# Patient Record
Sex: Male | Born: 1980 | Race: Black or African American | Hispanic: No | Marital: Married
Health system: Southern US, Community
[De-identification: ages and names within clinical notes are randomized; demographics above are authoritative.]

---

## 2010-11-07 ENCOUNTER — Emergency Department (HOSPITAL_COMMUNITY): Admission: EM | Admit: 2010-11-07 | Discharge: 2010-11-07 | Payer: Self-pay | Admitting: Emergency Medicine

## 2011-02-28 LAB — POCT I-STAT, CHEM 8
Creatinine, Ser: 1.2 mg/dL (ref 0.4–1.5)
HCT: 49 % (ref 39.0–52.0)
Hemoglobin: 16.7 g/dL (ref 13.0–17.0)
Potassium: 3.7 mEq/L (ref 3.5–5.1)
Sodium: 141 mEq/L (ref 135–145)
TCO2: 25 mmol/L (ref 0–100)

## 2011-02-28 LAB — DIFFERENTIAL
Basophils Absolute: 0 10*3/uL (ref 0.0–0.1)
Basophils Relative: 1 % (ref 0–1)
Eosinophils Absolute: 0.2 10*3/uL (ref 0.0–0.7)
Eosinophils Relative: 4 % (ref 0–5)
Neutrophils Relative %: 45 % (ref 43–77)

## 2011-02-28 LAB — CBC
MCH: 31.7 pg (ref 26.0–34.0)
MCHC: 34.5 g/dL (ref 30.0–36.0)
MCV: 91.8 fL (ref 78.0–100.0)
Platelets: 192 10*3/uL (ref 150–400)
RDW: 13 % (ref 11.5–15.5)

## 2011-02-28 LAB — POCT CARDIAC MARKERS: Myoglobin, poc: 46.4 ng/mL (ref 12–200)

## 2011-06-19 IMAGING — CR DG CHEST 2V
2 series · 2 of 2 positions shown · non-contrast
Comparison: None.

CLINICAL DATA: Chest pain.

CHEST - 2 VIEW

[w chest pa]
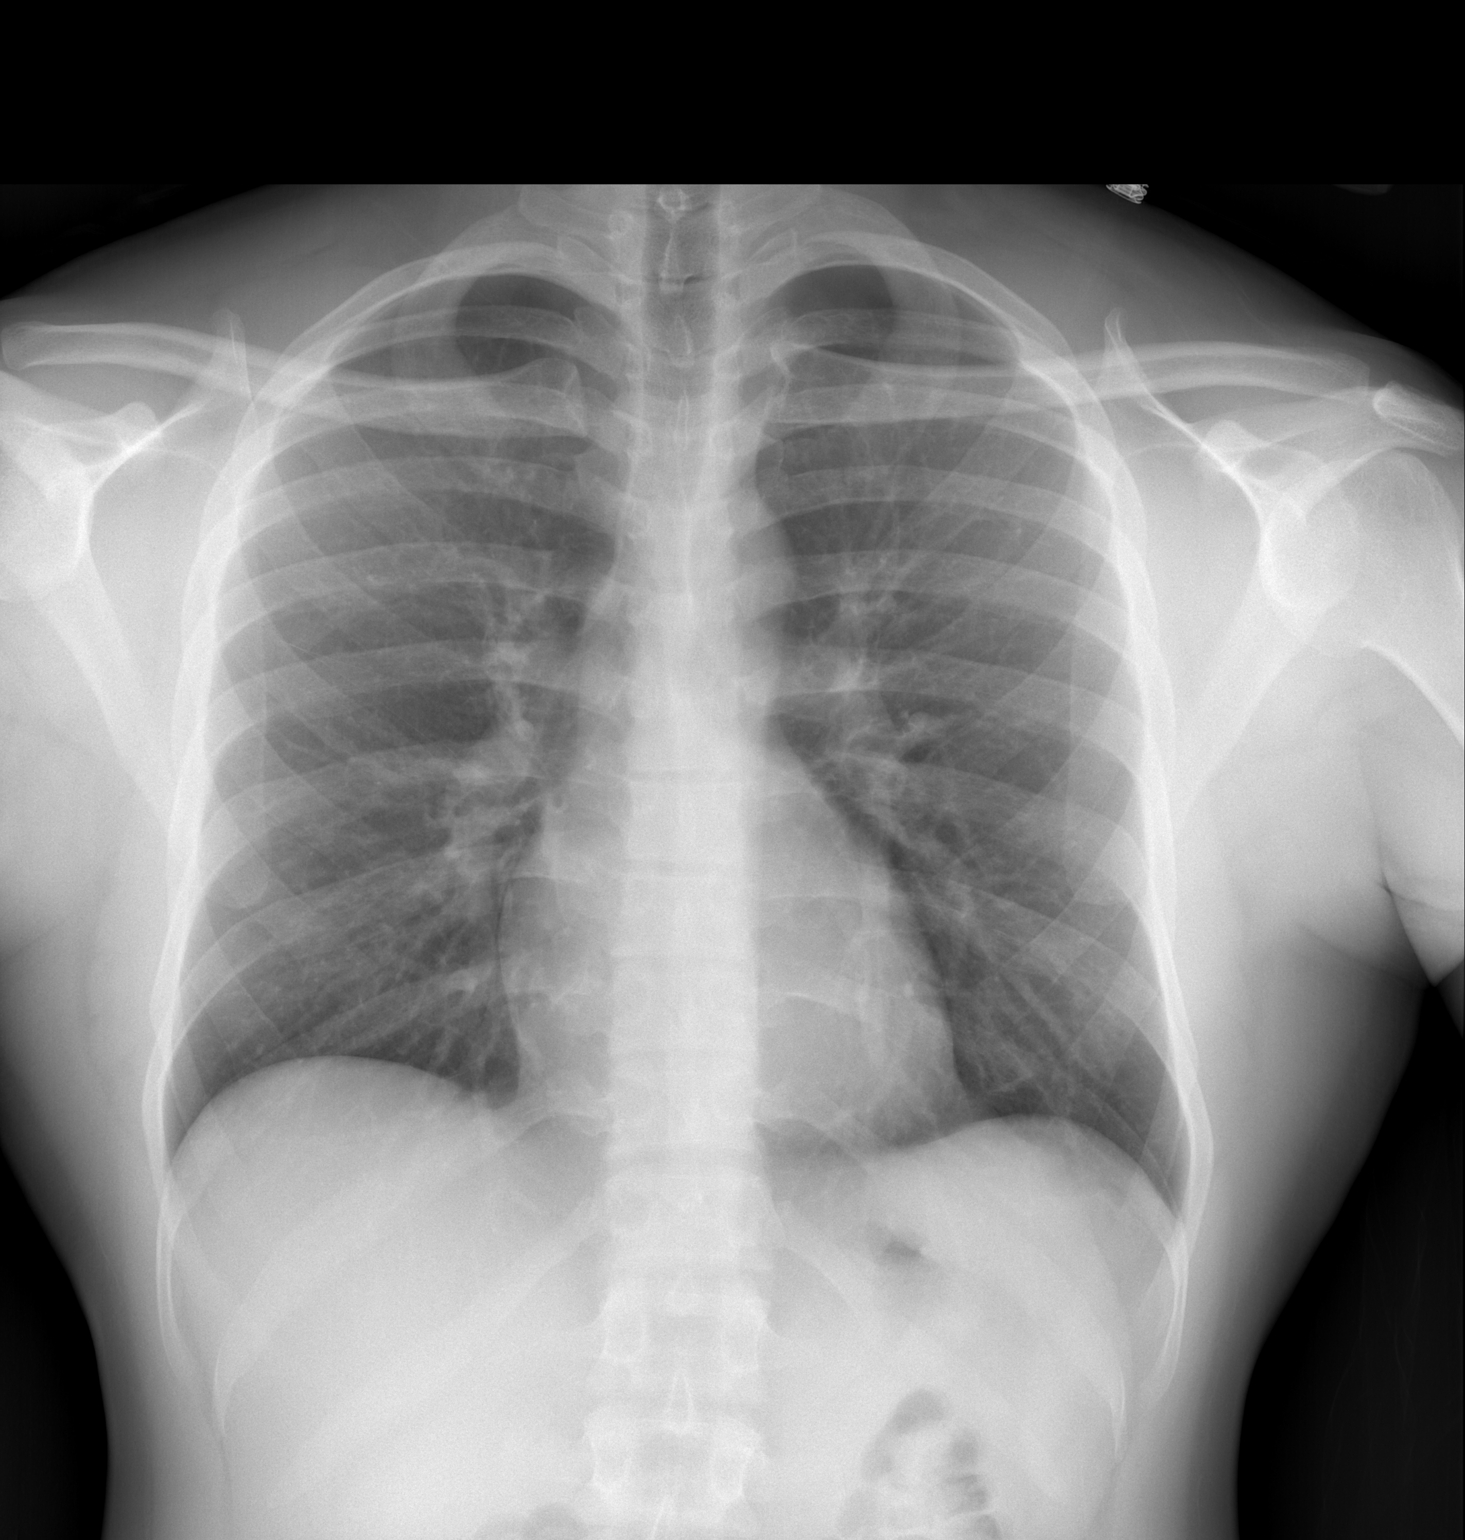

[w chest lat]
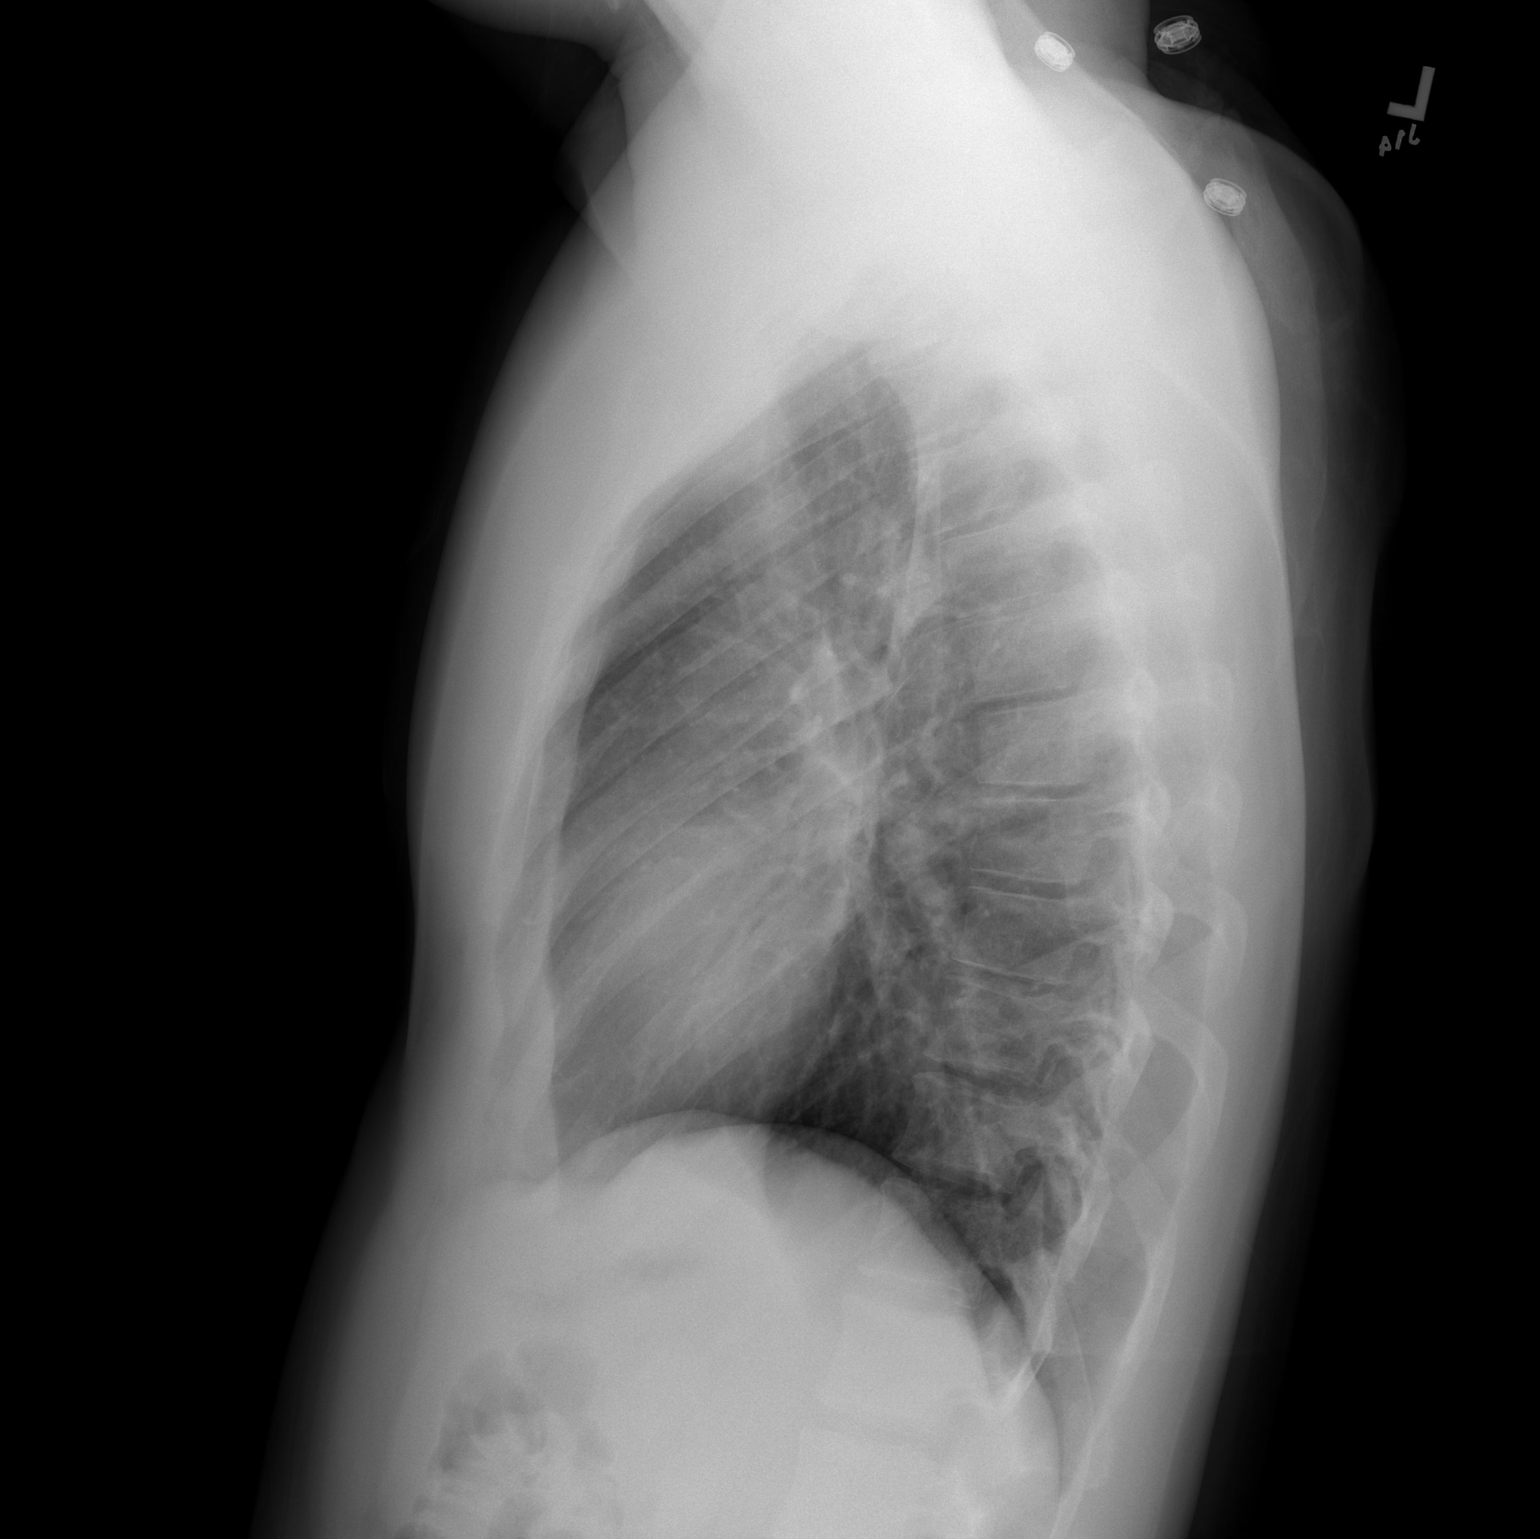

[2 of 2 positions shown; findings below may reference images not displayed]

FINDINGS: Lungs are clear.  No pneumothorax or pleural effusion.
Heart size normal.  No focal bony abnormality.
IMPRESSION: Normal chest.

## 2023-01-30 ENCOUNTER — Emergency Department (HOSPITAL_COMMUNITY)
Admission: EM | Admit: 2023-01-30 | Discharge: 2023-01-30 | Disposition: A | Discharge status: Home / Self Care | Payer: Federal, State, Local not specified - PPO | Attending: Emergency Medicine | Admitting: Emergency Medicine

## 2023-01-30 ENCOUNTER — Emergency Department (HOSPITAL_COMMUNITY): Payer: Federal, State, Local not specified - PPO

## 2023-01-30 ENCOUNTER — Encounter (HOSPITAL_COMMUNITY): Payer: Self-pay

## 2023-01-30 ENCOUNTER — Other Ambulatory Visit: Payer: Self-pay

## 2023-01-30 DIAGNOSIS — R110 Nausea: Secondary | ICD-10-CM | POA: Insufficient documentation

## 2023-01-30 DIAGNOSIS — R197 Diarrhea, unspecified: Secondary | ICD-10-CM | POA: Diagnosis not present

## 2023-01-30 DIAGNOSIS — R1011 Right upper quadrant pain: Secondary | ICD-10-CM | POA: Insufficient documentation

## 2023-01-30 LAB — CBC WITH DIFFERENTIAL/PLATELET
Abs Immature Granulocytes: 0.02 10*3/uL (ref 0.00–0.07)
Basophils Absolute: 0 10*3/uL (ref 0.0–0.1)
Basophils Relative: 1 %
Eosinophils Absolute: 0.1 10*3/uL (ref 0.0–0.5)
Eosinophils Relative: 3 %
HCT: 44.5 % (ref 39.0–52.0)
Hemoglobin: 14.9 g/dL (ref 13.0–17.0)
Immature Granulocytes: 1 %
Lymphocytes Relative: 19 %
Lymphs Abs: 0.8 10*3/uL (ref 0.7–4.0)
MCH: 29.5 pg (ref 26.0–34.0)
MCHC: 33.5 g/dL (ref 30.0–36.0)
MCV: 88.1 fL (ref 80.0–100.0)
Monocytes Absolute: 0.2 10*3/uL (ref 0.1–1.0)
Monocytes Relative: 5 %
Neutro Abs: 3.1 10*3/uL (ref 1.7–7.7)
Neutrophils Relative %: 71 %
Platelets: 167 10*3/uL (ref 150–400)
RBC: 5.05 MIL/uL (ref 4.22–5.81)
RDW: 12.9 % (ref 11.5–15.5)
WBC: 4.3 10*3/uL (ref 4.0–10.5)
nRBC: 0 % (ref 0.0–0.2)

## 2023-01-30 LAB — COMPREHENSIVE METABOLIC PANEL
ALT: 27 U/L (ref 0–44)
AST: 19 U/L (ref 15–41)
Albumin: 4 g/dL (ref 3.5–5.0)
Alkaline Phosphatase: 53 U/L (ref 38–126)
Anion gap: 5 (ref 5–15)
BUN: 12 mg/dL (ref 6–20)
CO2: 29 mmol/L (ref 22–32)
Calcium: 8.7 mg/dL — ABNORMAL LOW (ref 8.9–10.3)
Chloride: 107 mmol/L (ref 98–111)
Creatinine, Ser: 1.17 mg/dL (ref 0.61–1.24)
GFR, Estimated: >60 mL/min (ref >60)
Glucose, Bld: 122 mg/dL — ABNORMAL HIGH (ref 70–99)
Potassium: 4.1 mmol/L (ref 3.5–5.1)
Sodium: 141 mmol/L (ref 135–145)
Total Bilirubin: 0.8 mg/dL (ref 0.3–1.2)
Total Protein: 6.9 g/dL (ref 6.5–8.1)

## 2023-01-30 LAB — LIPASE, BLOOD: Lipase: 28 U/L (ref 11–51)

## 2023-01-30 MED ORDER — FENTANYL CITRATE PF 50 MCG/ML IJ SOSY
50.0000 ug | PREFILLED_SYRINGE | Freq: Once | INTRAMUSCULAR | Status: AC
  Administered 2023-01-30: 50 ug via INTRAVENOUS
  Filled 2023-01-30: qty 1

## 2023-01-30 MED ORDER — ONDANSETRON HCL 4 MG PO TABS: 4.0000 mg | ORAL_TABLET | Freq: Three times a day (TID) | ORAL | 0 refills | Status: AC | PRN

## 2023-01-30 MED ORDER — FAMOTIDINE 20 MG PO TABS: 20.0000 mg | ORAL_TABLET | Freq: Two times a day (BID) | ORAL | 0 refills | Status: AC

## 2023-01-30 MED ORDER — ONDANSETRON HCL 4 MG PO TABS: 4.0000 mg | ORAL_TABLET | Freq: Three times a day (TID) | ORAL | 0 refills | Status: DC | PRN

## 2023-01-30 MED ORDER — IOHEXOL 300 MG/ML  SOLN
100.0000 mL | Freq: Once | INTRAMUSCULAR | Status: AC | PRN
  Administered 2023-01-30: 100 mL via INTRAVENOUS

## 2023-01-30 MED ORDER — FAMOTIDINE 20 MG PO TABS: 20.0000 mg | ORAL_TABLET | Freq: Two times a day (BID) | ORAL | 0 refills | Status: DC

## 2023-01-30 MED ORDER — OXYCODONE-ACETAMINOPHEN 5-325 MG PO TABS: 1.0000 | ORAL_TABLET | Freq: Four times a day (QID) | ORAL | 0 refills | Status: AC | PRN

## 2023-01-30 NOTE — ED Triage Notes (Signed)
RUQ pain that started this AM, worse after eating pizza around 8am. Pt had nausea, no vomiting. Nausea has subsided. Pt also had a few episodes of diarrhea this AM.

## 2023-01-30 NOTE — ED Provider Notes (Signed)
Patient care taken over at shift handoff from outgoing provider.  Physical Exam  BP (!) 133/95   Pulse 77   Temp 97.6 F (36.4 C) (Oral)   Resp 14   Ht 5' 9"$  (1.753 m)   Wt 88.5 kg   SpO2 96%   BMI 28.80 kg/m   Physical Exam Vitals and nursing note reviewed.  Constitutional:      Appearance: Normal appearance.  HENT:     Head: Normocephalic and atraumatic.     Mouth/Throat:     Mouth: Mucous membranes are moist.  Eyes:     General: No scleral icterus. Cardiovascular:     Rate and Rhythm: Normal rate and regular rhythm.     Pulses: Normal pulses.     Heart sounds: Normal heart sounds.  Pulmonary:     Effort: Pulmonary effort is normal.     Breath sounds: Normal breath sounds.  Abdominal:     General: Abdomen is flat.     Palpations: Abdomen is soft.     Tenderness: There is abdominal tenderness in the right upper quadrant and right lower quadrant.  Musculoskeletal:        General: No deformity.  Skin:    General: Skin is warm.     Findings: No rash.  Neurological:     General: No focal deficit present.     Mental Status: He is alert.  Psychiatric:        Mood and Affect: Mood normal.     Procedures  Procedures  ED Course / MDM    Medical Decision Making Amount and/or Complexity of Data Reviewed Labs: ordered. Radiology: ordered.  Risk Prescription drug management.   41 year old male presents today for evaluation of right upper quadrant abdominal pain which began at 8 AM today.  Workup with negative ultrasound and CT scan of the abdomen and pelvis.  White count was unremarkable.  Electrolytes with no abnormalities.  Pain is controlled with fentanyl.  Rx of Zofran, Pepcid, Percocet was sent. Advised patient to take Tylenol/ibuprofen/naproxen or Percocet as needed for pain, follow-up with primary care physician for further evaluation and management, return to the ER if new or worsening symptoms.  Disposition Continued outpatient therapy. Follow-up with PCP  recommended for reevaluation of symptoms. Treatment plan discussed with patient.  Pt acknowledged understanding was agreeable to the plan. Worrisome signs and symptoms were discussed with patient, and patient acknowledged understanding to return to the ED if they noticed these signs and symptoms. Patient was stable upon discharge.   This chart was dictated using voice recognition software.  Despite best efforts to proofread,  errors can occur which can change the documentation meaning.          Rex Kras, PA 01/30/23 2129    Godfrey Pick, MD 01/31/23 [phone]

## 2023-01-30 NOTE — ED Provider Notes (Addendum)
[facility] Provider Note   CSN: [id number] Arrival date & time: 01/30/23  1118     History  Chief Complaint  Patient presents with   Abdominal Pain    Kenneth Mcclure is a 42 y.o. male.  Patient presents the emergency room complaining of right upper quadrant abdominal pain which began this morning at approximately 8 AM.  Patient states he ate a piece of pizza at 8 AM before the pain started.  He complains of associated nausea with no emesis, also complains of diarrhea.  Patient denies chest pain, shortness of breath, urinary symptoms, headache, fever.  The patient states he recently had a physical and has no current medical problems that are being addressed.  He denies history of hypertension, diabetes.  HPI     Home Medications Prior to Admission medications   Medication Sig Start Date End Date Taking? Authorizing Provider  famotidine (PEPCID) 20 MG tablet Take 1 tablet (20 mg total) by mouth 2 (two) times daily. 01/30/23  Yes Cherlynn June B, PA-C  ondansetron (ZOFRAN) 4 MG tablet Take 1 tablet (4 mg total) by mouth every 8 (eight) hours as needed for nausea or vomiting. 01/30/23  Yes Dorothyann Peng, PA-C      Allergies    Patient has no allergy information on record.    Review of Systems   Review of Systems  Gastrointestinal:  Positive for abdominal pain, diarrhea and nausea. Negative for vomiting.    Physical Exam Updated Vital Signs BP 122/84   Pulse 71   Temp 97.8 F (36.6 C) (Oral)   Resp 14   Ht 5' 9"$  (1.753 m)   Wt 88.5 kg   SpO2 98%   BMI 28.80 kg/m  Physical Exam Vitals and nursing note reviewed.  Constitutional:      General: He is not in acute distress.    Appearance: He is well-developed.  HENT:     Head: Normocephalic and atraumatic.  Eyes:     General: No scleral icterus.    Conjunctiva/sclera: Conjunctivae normal.  Cardiovascular:     Rate and Rhythm: Normal rate and regular rhythm.      Heart sounds: No murmur heard. Pulmonary:     Effort: Pulmonary effort is normal. No respiratory distress.     Breath sounds: Normal breath sounds.  Abdominal:     Palpations: Abdomen is soft.     Tenderness: There is abdominal tenderness (Mild right sided tenderness).  Musculoskeletal:        General: No swelling.     Cervical back: Neck supple.  Skin:    General: Skin is warm and dry.     Capillary Refill: Capillary refill takes less than 2 seconds.  Neurological:     Mental Status: He is alert.  Psychiatric:        Mood and Affect: Mood normal.     ED Results / Procedures / Treatments   Labs (all labs ordered are listed, but only abnormal results are displayed) Labs Reviewed  COMPREHENSIVE METABOLIC PANEL - Abnormal; Notable for the following components:      Result Value   Glucose, Bld 122 (*)    Calcium 8.7 (*)    All other components within normal limits  LIPASE, BLOOD  CBC WITH DIFFERENTIAL/PLATELET  URINALYSIS, ROUTINE W REFLEX MICROSCOPIC    EKG None  Radiology US Abdomen Limited RUQ (LIVER/GB)  Result Date: 01/30/2023 CLINICAL DATA:  Right upper quadrant pain EXAM: ULTRASOUND ABDOMEN LIMITED RIGHT  UPPER QUADRANT COMPARISON:  None Available. FINDINGS: Gallbladder: No gallstones or wall thickening visualized. No sonographic Murphy sign noted by sonographer. Common bile duct: Diameter: 2.6 mm, normal.  No intrahepatic ductal dilation. Liver: Increased liver echogenicity. No focal lesion identified. Portal vein is patent on color Doppler imaging with normal direction of blood flow towards the liver. Other: None. IMPRESSION: No evidence of cholecystitis or biliary obstruction. Hepatic steatosis. Electronically Signed   By: Maurine Simmering M.D.   On: 01/30/2023 13:15    Procedures Procedures    Medications Ordered in ED Medications - No data to display  ED Course/ Medical Decision Making/ A&P                             Medical Decision Making Amount and/or  Complexity of Data Reviewed Labs: ordered. Radiology: ordered.  Risk Prescription drug management.   This patient presents to the ED for concern of abdominal pain, this involves an extensive number of treatment options, and is a complaint that carries with it a high risk of complications and morbidity.  The differential diagnosis includes cholecystitis, choledocholithiasis, gastroenteritis, gastritis, appendicitis   Co morbidities that complicate the patient evaluation  None   Additional history obtained:  Additional history obtained from EMS    Lab Tests:  I Ordered, and personally interpreted labs.  The pertinent results include: Grossly unremarkable CMP, CBC; lipase 28   Imaging Studies ordered:  I ordered imaging studies including right upper quadrant ultrasound I independently visualized and interpreted imaging which showed  No evidence of cholecystitis or biliary obstruction.    Hepatic steatosis.   I agree with the radiologist interpretation   Test / Admission - Considered:  Patient's workup is grossly unremarkable.  Patient has very mild pain, no vomiting.  Right upper quadrant ultrasound shows no acute findings.  No significant abdominal tenderness to suggest appendicitis at this time.  No elevation of white count.  Plan to discharge patient home.  This may be gastritis versus reflux versus gastroenteritis.  Patient will be prescribed short course of Pepcid with recommendations to follow-up with primary care.    Just prior to discharge patient called the nurse due to his pain returning. Patient appears uncomfortable, grimacing in pain. Pain medication ordered. CT abdomen pelvis ordered. Patient care being transferred to oncoming provider at shift handoff. Disposition pending reassessment and CT results        Final Clinical Impression(s) / ED Diagnoses Final diagnoses:  Nausea  Diarrhea, unspecified type  Right upper quadrant abdominal pain    Rx /  DC Orders ED Discharge Orders          Ordered    famotidine (PEPCID) 20 MG tablet  2 times daily        01/30/23 1447    ondansetron (ZOFRAN) 4 MG tablet  Every 8 hours PRN        01/30/23 1447              [name] 01/30/23 1450    [name] 01/30/23 1515    Blanchie Dessert, MD 01/30/23 1819

## 2023-01-30 NOTE — ED Notes (Signed)
Going to d/c pt and pt states his pain has returned in his right side of abdomen. Fritz Pickerel, [place] notified

## 2023-01-30 NOTE — ED Notes (Signed)
Lab called regarding urine sample. Lab states they do not have the sample. Sample was sent at 1247.

## 2023-01-30 NOTE — ED Notes (Signed)
Pt wife at bedside.

## 2023-01-30 NOTE — Discharge Instructions (Signed)
You were evaluated today for abdominal pain.  Your workup was reassuring.  You may have a viral illness or may have inflammation causing some of your symptoms.  I have prescribed Pepcid for acid control and Zofran for nausea.  Please follow-up as needed with your primary care provider If you develop worsening abdominal pain, intractable nausea and vomiting, or other life-threatening symptoms, please return to the emergency department

## 2023-01-30 NOTE — ED Notes (Signed)
Pt to ct via stretcher

## 2024-03-03 DIAGNOSIS — B009 Herpesviral infection, unspecified: Secondary | ICD-10-CM | POA: Insufficient documentation

## 2024-05-06 ENCOUNTER — Ambulatory Visit: Admitting: Medical Genetics

## 2024-05-06 VITALS — BP 126/86 | HR 73 | Wt 196.1 lb

## 2024-05-06 DIAGNOSIS — Z1589 Genetic susceptibility to other disease: Secondary | ICD-10-CM | POA: Diagnosis not present

## 2024-05-06 NOTE — Progress Notes (Signed)
 GENETIC COUNSELING NEW PATIENT EVALUATION Patient name: Kenneth Mcclure DOB: 05-07-1981 Age: 43 y.o. MRN: [id number]  Referring Provider/Specialty: [place] Haldeman Englert Date of Evaluation: 05/06/2024 Chief Complaint/Reason for Referral: Hereditary Paraganglioma Pheochromocytoma syndrome   Brief Summary: Kenneth Mcclure is a 43 y.o. 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 He is accompanied by his spouse and three children at today's visit.  Prior genetic testing has been performed. 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   Family History: See pedigree obtained during today's visit under History->Family->Pedigree.  The family history was notable for the following: Son, 71 yo, alive and well. 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 Daughter, 29 yo, with cerebral palsy and developmental delay related to prematurity and IVH. Sister, 70 yo, with diabetes. Sister, 88 yo, with profound disability, unable to walk or talk. Brother, 7 yo, alive and well.  Paternal Family History Father, 46 yo, with diabetes. Six aunts and/or uncles deceased from non-medical causes. Aunt, alive and well. Grandfather and grandmother, deceased in their 68s from non-medical causes.  Maternal Family History Mother, 83 yo, with breast cancer diagnosed at 44 yo, diabetes, hypertension, and Addison's disease. We discussed that individuals with a diagnosis of breast cancer under 23 yo meet criteria for genetic testing for hereditary cancer syndromes.  Information regarding scheduling an appointment for cancer genetic counseling was provided to the family. Grandmother and grandfather deceased from non-medical reasons.  Mother's ethnicity: Tree surgeon, Native  American Father's ethnicity: African American, Native American Consangunity: Denies   Prior Genetic testing: Prior genetic testing has been performed. 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  Genetic Counseling: Kenneth Mcclure is a 43 y.o. 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  For detailed HPI, please see accompanying note from Dr. [place] Haldeman Englert.  Kenneth Mcclure had comparator genetic testing performed as part of a [employer] for his son.  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   An explanation of this testing can be found below.  A paternally inherited, heterozygous, likely pathogenic variant in SDHAF2 ([id number] / [id number]) causing premature termination of the protein product and loss of function was identified in Kenneth Mcclure and his father (Kenneth Mcclure).  Pathogenic variants in this gene are associated with Hereditary Paraganglioma and Pheochromocytoma syndrome Type 2.   Clinical Description Single pathogenic variants in the SDHAF2 gene are associated with the development of primarily head/neck paragangliomas (PGL) as seen in hereditary paraganglioma-pheochromocytoma syndrome. It is unclear if pathogenic variants in SDHAF2 are associated with other cancers as the data are limited and emerging (PMID: 86578469, [id number]). The clinical presentation is highly variable among those with a pathogenic variant in SDHAF2 and may be difficult to predict. An individual with a single SDHAF2 pathogenic variant will not necessarily develop cancer in their lifetime, but the risk for cancer is increased over that of the general population.   Pathogenic variants in SDHAF2 are primarily associated with a risk of developing  head/neck PGL (HNPGL). The age at diagnosis is highly variable but thought to present most commonly in the 3rd decade and the risk of malignancy appears to be low. Additionally, the penetrance  appears to be low, meaning most individuals with a pathogenic variant in this gene will not develop PGL.   Inheritance Pathogenic variants in SDHAF2 resulting in HNPGL have an autosomal dominant inheritance. This means that an individual with a pathogenic variant has a 50% chance of passing the condition on to their offspring. This means that each of Kenneth Mcclure's siblings have a 50% chance of inheriting the SDHAF2 variant. The risk for tumors, however, is clearly known to be increased only when the SDHAF2 mutation is inherited from the father.  Mutations in the SDHAF2 are rare, and therefore exact risks have not been characterized.  With this result, it is now possible to identify at-risk relatives who can pursue testing for this specific familial variant. Many cases are inherited from a parent, but some cases can occur spontaneously (i.e., an individual with a pathogenic variant has parents who do not have it).  We also discussed that variants in the SDHAF2 gene exhibit parent-of-origin effects  and cause disease almost exclusively when they are paternally inherited: an individual who inherits an SDHAF2 pathogenic variant from the individual's father is at risk of manifesting PGLs and PCCs; an individual who inherits SDHAF2 pathogenic variant from the individual's mother is usually not at risk of developing disease - however, exceptions occur. If Kenneth Mcclure parents pursue their own testing and share their results, we may be better able to estimate his risk for tumors.   Management It is suggested that individuals with hereditary paraganglioma-pheochromocytoma syndrome, along with their at-risk relatives, have regular clinical monitoring by a physician or medical team with expertise in the treatment of hereditary GIST  and PGL/PCC syndromes. A consultation with an endocrine surgeon, endocrinologist, and otolaryngologist is also recommended to establish an individualized care plan (PMID: 16109604).   Screening should begin between 76-83 years of age, or at least ten years before the earliest age at diagnosis in the family (PMID: 54098119). There is currently no clear consensus regarding a screening and surveillance protocol for individuals with pathogenic SDHAF2 variants; consideration can be given to modified screening intervals, especially for less penetrant genes (NCCN 1.2023 Neuroendocrine and Adrenal Tumors). Physical exam and blood pressure at the time of diagnosis and then every 6-12 months Annual 24-hour urinary excretion of fractionated metanephrines and catecholamines and/or plasma fractionated metanephrines to detect metastatic disease, tumor recurrence, or development of additional tumors Follow up with imaging by CT, MRI, 123I-MIBG (metaiodobenzylguanidine) scintigraphy, or FDG-PET if the fractionated metanephrine and/or catecholamine levels become elevated, or if the original tumor had minimal or no catecholamine/fractionated metanephrine excess Periodic MRI or CT inclusive of head, neck, thorax, abdomen, and pelvic areas In order to minimize radiation exposure, MRI may be the preferable imaging modality with CT and nuclear imaging reserved to further characterize detected tumors Imaging modalities should be at the discretion of the managing provider due to conflicting data regarding the utility and efficacy of the various options (PMID: 14782956) Available data suggest that those with SDHAF2 mutations are primarily at risk for head and neck tumors.  Therefore, consideration can be given to more targeted imaging. Evaluation of cases with extra-adrenal sympathetic PGL and PCC for blood pressure elevations, tachycardia, and other signs and symptoms of catecholamine hypersecretion Consideration of evaluation  for GISTs in individuals (especially children, adolescents, or young adults) who have unexplained gastrointestinal symptoms (e.g., abdominal pain, upper gastrointestinal bleeding, nausea, vomiting, difficulty swallowing) or who experience unexplained intestinal obstruction or anemia Medical genetics consultation   Summary of surveillance recommendations (PMID: 21308657, [id number], [id number]):  Recommendation Screening - SDHA  Age to begin screening 10-15 years  Physical exam and blood pressure Every 6-12 months  Urinary excretion of fractionated metanephrines and catecholamines in 24 hours Annually  Full body MRI Every 2-3 years (PMID: 95284132)    It remains unclear whether imaging studies should be conducted as frequently in childhood as in adulthood (PMID: 44010272).  A referral to endocrinology will be placed following today's appointment.  Recommendations: Referral to endocrinology. Encourage parents to pursue genetic testing to better clarify Mr. Millhouse's risk for tumors. Continue follow-up with other healthcare providers as recommended.  Date: 05/06/2024 Total time spent: 85 minutes Genetic Counselor-only time: 40 minutes  Time spent includes face to face and non-face to face care for the patient on the date of this encounter (history, genetic counseling, coordination of care, data gathering and/or documentation as outlined).   Philbert Brave MS [facility] Certified Genetic Counselor [facility] [employer]

## 2024-05-06 NOTE — Progress Notes (Signed)
 MEDICAL GENETICS NEW PATIENT EVALUATION  Patient name: Kenneth Mcclure DOB: Apr 16, 1981 Age: 43 y.o. MRN: [id number]  Primary Care Provider: [facility] Date of Evaluation: 05/06/2024 Chief Complaint/Reason for Referral: Positive genetic testing result  Assessment: We discussed several aspects of Kenneth Mcclure's diagnosis with him today. This included that the penetrance for patients with mutations to the SHDAF2 gene is low, such that there is a chance that he may not develop any symptoms of the condition. In addition, most of the cancers that do develop tend to occur in the head and neck region. This condition is generally managed by endocrinology, and we will refer him there for additional evaluation and monitoring.  We also discussed with Kenneth Mcclure that this condition is inherited in an autosomal dominant manner. Therefore, all of his children have a 50% chance for inheriting the condition. He also may have inherited this from one of his parents, in which case his siblings could also be affected and tested for this condition if interested.  Recommendations: Referral to endocrinology. Consider variant testing of other family members if interested. Continue follow up with current medical providers per their recommendations.  Follow up in genetics clinic as needed.   Information on Hereditary Paraganglioma-Pheochromocytoma Syndromes from [email or web address] (2023) Clinical characteristics. Hereditary paraganglioma-pheochromocytoma (PGL/PCC) syndromes are characterized by paragangliomas (tumors that arise from neuroendocrine tissues distributed along the paravertebral axis from the base of the skull to the pelvis) and pheochromocytomas (paragangliomas that are confined to the adrenal medulla). Sympathetic paragangliomas cause catecholamine excess; parasympathetic paragangliomas are most often nonsecretory. Extra-adrenal parasympathetic paragangliomas are located  predominantly in the skull base and neck (referred to as head and neck paragangliomas [HNPGLs]) and sometimes in the upper mediastinum; approximately 95% of such tumors are nonsecretory. In contrast, extra-adrenal sympathetic paragangliomas are generally confined to the lower mediastinum, abdomen, and pelvis, and are typically secretory. Pheochromocytomas, which arise from the adrenal medulla, typically lead to catecholamine excess. Symptoms of PGL/PCCs result from either mass effects or catecholamine hypersecretion (e.g., sustained or paroxysmal elevations in blood pressure, headache, episodic profuse sweating, forceful palpitations, pallor, and apprehension or anxiety). The risk for developing metastatic disease is greater for extra-adrenal sympathetic paragangliomas than for pheochromocytomas. Additional tumors reported in individuals with hereditary PGL/PCC syndromes include gastrointestinal stromal tumors (GISTs), pulmonary chondromas, and clear cell renal cell carcinoma. Diagnosis/testing. A diagnosis of a hereditary PGL/PCC syndrome is strongly suspected in an individual with multiple, multifocal, recurrent, or early-onset paraganglioma or pheochromocytoma and/or a family history of paraganglioma or pheochromocytoma. The diagnosis is established in a proband with a personal or family history of paraganglioma or pheochromocytoma and a germline heterozygous pathogenic variant in MAX, SDHA, SDHAF2, SDHB, SDHC, SDHD, or TMEM127 identified by molecular genetic testing. Management. Treatment of manifestations: SDHB-related PGL/PCCs are typically treated with surgical resection because of the higher risk for metastatic disease. In general, most HNPGLs (carotid body, glomus jugulotympanicum, vagal, and jugular paragangliomas) are nonsecretory and may be treated with active observation, surgical resection, or radiation therapy. For secretory PGL/PCCs, alpha-adrenergic receptor blockade followed by surgical  resection. All individuals with HNPGLs should be evaluated for catecholamine excess before surgical resection, which, if present, can suggest an additional primary PGL/PCC. Metastatic PGL/PCCs are treated with blood pressure control, surgical debulking, radiation therapy especially for bony lesions, liver-directed therapy, systemic chemotherapy, or radionuclide therapy. GIST treatment includes surgical resection and/or tyrosine kinase inhibitor. Clear cell renal cell carcinoma treatment is early surgical resection and standard treatments for metastatic disease. Surveillance: Individuals at risk  for hereditary PGL/PCC syndromes should have annual clinical assessment for manifestations of PGL/PCCs and GISTs, plasma-free fractionated metanephrines or 24-hour urine fractionated metanephrines every two years in childhood and then annually in adults, and whole-body MRI every two to three years. Age of initiation for screening varies by gene. Consider endoscopic evaluation for GISTs in individuals with unexplained anemia and gastrointestinal symptoms. Agents/circumstances to avoid: As for all cancer predisposition syndromes, activities such as cigarette smoking that predispose to chronic lung disease should be discouraged. Hypoxic conditions (e.g., cyanotic heart disease, cigarette smoking) may increase tumor incidence and promote tumor growth, although data are extremely limited. Evaluation of relatives at risk: First-degree relatives of an individual with a hereditary PGL/PCC syndrome and a known MAX, SDHA, SDHAF2, SDHB, SDHC, SDHD, or TMEM127 pathogenic variant should be offered molecular genetic testing to clarify their genetic status to improve diagnostic certainty and reduce the need for costly screening procedures in those who have not inherited the pathogenic variant. Genetic counseling. Hereditary PGL/PCC syndromes are inherited in an autosomal dominant manner. Most individuals diagnosed with a hereditary  PGL/PCC syndrome inherited a PGL/PCC-related pathogenic variant from a parent; rarely, a proband with a hereditary PGL/PCC syndrome has the disorder as the result of a de novo pathogenic variant. Each child of an individual with a hereditary PGL/PCC syndrome-causing pathogenic variant has a 50% chance of inheriting the pathogenic variant. Pathogenic variants in SDHD, SDHAF2, and possibly MAX demonstrate parent-of-origin effects and cause disease almost exclusively when they are paternally inherited: an individual who inherits an SDHD or SDHAF2 pathogenic variant from the individual's father is at high risk of manifesting PGLs and PCCs; an individual who inherits an SDHD or SDHAF2 pathogenic variant from the individual's mother is usually not at risk of developing disease - however, exceptions occur. Once the PGL/PCC syndrome-related pathogenic variant has been identified in an affected family member, predictive molecular genetic testing for at-risk family members and prenatal and preimplantation genetic testing are possible.   SDHAF2: Surveillance for Individuals at Risk and Affected Individuals Clinical assessment for manifestations of PGL/PCC & GIST Annually Beginning at age 45-8 yrs  Plasma-free fractionated metanephrines or 24-hour urine fractionated metanephrines (optional dopamine or 3-methoxytyramine) for secreting PGL/PCC Every 2 yrs in childhood; then annually in adults   Whole-body MRI for PGL, PCC, RCC, & GIST Note: Given that persons w/SDHAF2 pathogenic variants are primarily at risk for HNPGL, targeted imaging can be considered. Every 2-3 yrs   EGD for those w/unexplained anemia & GI symptoms As needed   EGD = esophagogastroduodenoscopy; GI = gastrointestinal; GIST = gastrointestinal stromal tumor; [facility] = pheochromocytoma; PGL = paraganglioma; RCC = renal cell carcinoma   HPI: Kenneth Mcclure is a 43 y.o. assigned male at birth who presents today for an initial genetics evaluation for positive  genetic testing. He provided the history. This information, along with a review of pertinent records, labs, and radiology studies, is summarized below.  Mr. Conant is generally healthy except for flat feet. He has a son with autism spectrum disorder who was seen in genetics in 12/2023. 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 Mr. Statzer has not noticed any lumps on his head or neck. His blood pressure and heart rate have been normal, and he does not feel any symptoms related to increased catecholamines or mass effect on other organs/structures.  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 30 tablet 0   ondansetron  (ZOFRAN ) 4 MG tablet Take 1 tablet (4 mg total) by mouth every 8 (eight) hours as needed for nausea or vomiting. 20 tablet 0   oxyCODONE -acetaminophen  (PERCOCET/ROXICET) 5-325 MG tablet Take 1 tablet by mouth every 6 (six) hours as needed for up to 5 doses for severe pain. 5 tablet 0   No current facility-administered medications on file prior to visit.   Allergies:  Not on File  Review of Systems: Negative except as noted in the HPI  Family History: The family history was notable for the following: Son, 69 yo, alive and well. Son, 85 yo, with autism and SDHAF2-related Hereditary Paraganglioma Pheochromocytoma syndrome. Daughter, 63 yo, with cerebral palsy and developmental delay related to prematurity and IVH. Sister, 56 yo, with diabetes. Sister, 81 yo, with profound disability, unable to walk or talk. Brother, 85 yo, alive and well.   Paternal Family  History Father, 35 yo, with diabetes. Six aunts and/or uncles deceased from non-medical causes. Aunt, alive and well. Grandfather and grandmother, deceased in their 2s from non-medical causes.   Maternal Family History Mother, 29 yo, with breast cancer diagnosed at 44 yo, diabetes, hypertension, and Addison's disease. Grandmother and grandfather deceased from non-medical reasons.   Mother's ethnicity: Tree surgeon, Native Financial risk analyst ethnicity: African American, Native Therapist, nutritional: Denies Please see the Dentist note for additional information  Social History: Lives with spouse and children in [place]  Vitals: Weight: 196.1 lb  Genetics Physical Exam:  Constitution: The patient is active and alert  Head: No abnormalities detected in: head, hairline, shape or size    Anterior fontanelle flat: not flat    Anterior fontanelle open: not open    Bitemporal narrowing: forehead not narrow    Frontal bossing: no frontal bossing    Macrocephaly: not macrocephalic    Microcephaly: not microcephalic    Plagiocephaly: not plagiocephalic  Neck: No abnormalities detected in: neck    Cysts: no cysts    Pits: no pits in neck    Redundant nuchal skin: no redundant neck skin    Webbing: no webbed neck  Cardiac: No abnormalities detected in: cardiovascular system    Abnormal distal perfusion: normal distal perfusion    Irregular rate: heart rate regular    Irregular rhythm: regular rhythm    Murmur: no murmur  Lungs: No abnormalities detected in: pulmonary system, bilateral auscultation or effort  Abdomen: No abnormalities detected in: abdomen or appearance    Abnormal umbilicus: normal umbilicus    Diastasis recti: no diastasis recti    Distended abdomen: no distension    Hepatosplenomegaly: no hepatosplenomegaly    Umbilical hernia: no umbilical hernia  Neurological: No abnormalities detected in: neurological system, deep tendon reflexes,  antigravity movement of extremities, strength, facial movement or tone    Hypertonia: not hypertonic    Hypotonia: not hypotonic  Genitourinary: not assessed  Extremities: No abnormalities detected in: extremities    Asymmetric girth: symmetric girth    Contractures: no joint contractures    Limited range of motion: non-limited ROM   [place] Haldeman-Englert, MD [facility] Date: 05/06/2024 Time: 1000   Total time spent: 50 minutes Time spent includes face to face and non-face to face care for the patient  on the date of this encounter (history and physical, genetic counseling, coordination of care, data gathering and/or documentation as outlined).  Genetic counselor: Philbert Brave, MS, [facility]

## 2024-05-21 ENCOUNTER — Encounter: Payer: Self-pay | Admitting: Genetic Counselor

## 2024-07-17 ENCOUNTER — Other Ambulatory Visit: Payer: Self-pay | Admitting: Internal Medicine

## 2024-07-17 DIAGNOSIS — D447 Neoplasm of uncertain behavior of aortic body and other paraganglia: Secondary | ICD-10-CM

## 2024-08-12 ENCOUNTER — Ambulatory Visit
Admission: RE | Admit: 2024-08-12 | Discharge: 2024-08-12 | Disposition: A | Discharge status: Home / Self Care | Source: Ambulatory Visit | Attending: Internal Medicine | Admitting: Internal Medicine

## 2024-08-12 DIAGNOSIS — D447 Neoplasm of uncertain behavior of aortic body and other paraganglia: Secondary | ICD-10-CM

## 2024-08-12 MED ORDER — GADOPICLENOL 0.5 MMOL/ML IV SOLN
9.0000 mL | Freq: Once | INTRAVENOUS | Status: AC | PRN
  Administered 2024-08-12: 9 mL via INTRAVENOUS

## 2024-08-14 ENCOUNTER — Ambulatory Visit
Admission: RE | Admit: 2024-08-14 | Discharge: 2024-08-14 | Disposition: A | Discharge status: Home / Self Care | Source: Ambulatory Visit | Attending: Internal Medicine | Admitting: Internal Medicine

## 2024-08-14 DIAGNOSIS — D447 Neoplasm of uncertain behavior of aortic body and other paraganglia: Secondary | ICD-10-CM

## 2024-08-14 MED ORDER — GADOPICLENOL 0.5 MMOL/ML IV SOLN
9.0000 mL | Freq: Once | INTRAVENOUS | Status: AC | PRN
  Administered 2024-08-14: 9 mL via INTRAVENOUS

## 2024-08-19 ENCOUNTER — Ambulatory Visit
Admission: RE | Admit: 2024-08-19 | Discharge: 2024-08-19 | Disposition: A | Discharge status: Home / Self Care | Source: Ambulatory Visit | Attending: Internal Medicine | Admitting: Internal Medicine

## 2024-08-19 DIAGNOSIS — D447 Neoplasm of uncertain behavior of aortic body and other paraganglia: Secondary | ICD-10-CM

## 2024-08-19 MED ORDER — GADOPICLENOL 0.5 MMOL/ML IV SOLN
9.0000 mL | Freq: Once | INTRAVENOUS | Status: AC | PRN
  Administered 2024-08-19: 9 mL via INTRAVENOUS

## 2024-08-26 ENCOUNTER — Encounter: Payer: Self-pay | Admitting: Internal Medicine

## 2024-08-27 ENCOUNTER — Other Ambulatory Visit: Payer: Self-pay | Admitting: Internal Medicine

## 2024-08-27 DIAGNOSIS — R9389 Abnormal findings on diagnostic imaging of other specified body structures: Secondary | ICD-10-CM

## 2024-09-04 ENCOUNTER — Encounter: Payer: Self-pay | Admitting: Radiology

## 2024-09-04 ENCOUNTER — Ambulatory Visit
Admission: RE | Admit: 2024-09-04 | Discharge: 2024-09-04 | Disposition: A | Discharge status: Home / Self Care | Source: Ambulatory Visit | Attending: Internal Medicine | Admitting: Internal Medicine

## 2024-09-04 DIAGNOSIS — R9389 Abnormal findings on diagnostic imaging of other specified body structures: Secondary | ICD-10-CM

## 2024-09-04 MED ORDER — IOPAMIDOL (ISOVUE-300) INJECTION 61%
75.0000 mL | Freq: Once | INTRAVENOUS | Status: AC | PRN
  Administered 2024-09-04: 75 mL via INTRAVENOUS

## 2024-09-08 ENCOUNTER — Other Ambulatory Visit: Payer: Self-pay | Admitting: Internal Medicine

## 2024-09-08 DIAGNOSIS — D447 Neoplasm of uncertain behavior of aortic body and other paraganglia: Secondary | ICD-10-CM

## 2024-09-19 ENCOUNTER — Inpatient Hospital Stay: Admission: RE | Admit: 2024-09-19 | Source: Ambulatory Visit
